# Patient Record
Sex: Female | Born: 1985 | Race: White | Hispanic: No | Marital: Single | Smoking: Current every day smoker
Health system: Southern US, Community
[De-identification: ages and names within clinical notes are randomized; demographics above are authoritative.]

## PROBLEM LIST (undated history)

## (undated) HISTORY — PX: WISDOM TOOTH EXTRACTION: SHX21

---

## 2015-02-26 ENCOUNTER — Emergency Department (HOSPITAL_COMMUNITY)
Admission: EM | Admit: 2015-02-26 | Discharge: 2015-02-26 | Disposition: A | Discharge status: Home / Self Care | Payer: Medicaid Other | Source: Home / Self Care | Attending: Emergency Medicine | Admitting: Emergency Medicine

## 2015-02-26 ENCOUNTER — Encounter (HOSPITAL_COMMUNITY): Payer: Self-pay | Admitting: Emergency Medicine

## 2015-02-26 DIAGNOSIS — B349 Viral infection, unspecified: Secondary | ICD-10-CM | POA: Diagnosis not present

## 2015-02-26 MED ORDER — ONDANSETRON HCL 4 MG PO TABS: 4.0000 mg | ORAL_TABLET | Freq: Three times a day (TID) | ORAL | Status: DC | PRN

## 2015-02-26 MED ORDER — CETIRIZINE HCL 10 MG PO TABS
10.0000 mg | ORAL_TABLET | Freq: Every day | ORAL | Status: DC
Start: 2015-02-26 — End: 2019-06-09

## 2015-02-26 NOTE — ED Provider Notes (Signed)
CSN: [id number]     Arrival date & time 02/26/15  1502 History   First MD Initiated Contact with Patient 02/26/15 1611     Chief Complaint  Patient presents with  . Emesis  . Diarrhea   (Consider location/radiation/quality/duration/timing/severity/associated sxs/prior Treatment) HPI  She is a [age] year old woman here for evaluation of vomiting and diarrhea. She states her symptoms started 6 days ago with nasal congestion, rhinorrhea, cough. On Saturday she developed some nonbloody nonbilious vomiting as well as some watery nonbloody diarrhea. She denies any fevers or chills. No abdominal pain. Her daughter was sick with a cold recently. She is tolerating liquids well.  History reviewed. No pertinent past medical history. History reviewed. No pertinent past surgical history. History reviewed. No pertinent family history. History  Substance Use Topics  . Smoking status: Current Every Day Smoker -- 1.00 packs/day    Types: Cigarettes  . Smokeless tobacco: Not on file  . Alcohol Use: Yes   OB History    No data available     Review of Systems  Constitutional: Positive for appetite change. Negative for fever and chills.  HENT: Positive for congestion and rhinorrhea. Negative for sore throat.   Respiratory: Positive for cough. Negative for shortness of breath.   Gastrointestinal: Positive for nausea, vomiting and diarrhea. Negative for abdominal pain and constipation.  Musculoskeletal: Negative for myalgias.    Allergies  Penicillins  Home Medications   Prior to Admission medications   Medication Sig Start Date End Date Taking? Authorizing Provider  cetirizine (ZYRTEC) 10 MG tablet Take 1 tablet (10 mg total) by mouth daily. 02/26/15   Charm RingsErin J Yanis Larin, MD  ondansetron (ZOFRAN) 4 MG tablet Take 1 tablet (4 mg total) by mouth every 8 (eight) hours as needed for nausea or vomiting. 02/26/15   Charm RingsErin J Sharron Petruska, MD   BP 123/76 mmHg  Pulse 62  Temp(Src) 98.9 F (37.2 C) (Oral)  Resp 16   SpO2 99%  LMP 02/18/2015 Physical Exam  Constitutional: She is oriented to person, place, and time. She appears well-developed and well-nourished. No distress.  HENT:  Nose: Nose normal.  Mouth/Throat: Oropharynx is clear and moist. No oropharyngeal exudate.  Neck: Neck supple.  Cardiovascular: Normal rate, regular rhythm and normal heart sounds.   No murmur heard. Pulmonary/Chest: Breath sounds normal. No respiratory distress. She has no wheezes. She has no rales.  Abdominal: Soft. Bowel sounds are normal. She exhibits no distension. There is no tenderness. There is no rebound and no guarding.  Neurological: She is alert and oriented to person, place, and time.    ED Course  Procedures (including critical care time) Labs Review Labs Reviewed - No data to display  Imaging Review No results found.   MDM   1. Viral illness    Symptomatic treatment with Zofran and Zyrtec. Return precautions reviewed.    Charm RingsErin J Oluwadamilola Deliz, MD 02/26/15 [phone]

## 2015-02-26 NOTE — ED Notes (Signed)
Pt states that she has had vomiting and diarrhea intermittently since 02/20/2015 pt states 3 episodes of emesis today

## 2015-02-26 NOTE — Discharge Instructions (Signed)
You have a virus. Make sure you are drinking plenty of fluids. Take Zofran every 8 hours as needed for nausea or vomiting. You should start to improve in the next day or 2. If you aren't able to keep down fluids, you develop severe pain, you see blood in your stool, please go to the ER.

## 2015-05-27 ENCOUNTER — Encounter (HOSPITAL_COMMUNITY): Payer: Self-pay | Admitting: Emergency Medicine

## 2015-05-27 ENCOUNTER — Emergency Department (HOSPITAL_COMMUNITY)
Admission: EM | Admit: 2015-05-27 | Discharge: 2015-05-27 | Disposition: A | Discharge status: Home / Self Care | Payer: Medicaid Other | Source: Home / Self Care | Attending: Emergency Medicine | Admitting: Emergency Medicine

## 2015-05-27 ENCOUNTER — Emergency Department (INDEPENDENT_AMBULATORY_CARE_PROVIDER_SITE_OTHER): Payer: Medicaid Other

## 2015-05-27 DIAGNOSIS — S9032XA Contusion of left foot, initial encounter: Secondary | ICD-10-CM
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
## 2015-05-27 NOTE — Discharge Instructions (Signed)
You have bruised your foot. Wear the postop shoe for comfort. Apply ice as often as you can. You can take Tylenol or ibuprofen as needed for pain. Follow-up as needed.
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
## 2016-03-04 ENCOUNTER — Encounter (HOSPITAL_COMMUNITY): Payer: Self-pay | Admitting: Emergency Medicine

## 2016-03-04 ENCOUNTER — Emergency Department (HOSPITAL_COMMUNITY)
Admission: EM | Admit: 2016-03-04 | Discharge: 2016-03-04 | Disposition: A | Discharge status: Home / Self Care | Payer: Medicaid Other | Attending: Emergency Medicine | Admitting: Emergency Medicine

## 2016-03-04 DIAGNOSIS — F17210 Nicotine dependence, cigarettes, uncomplicated: Secondary | ICD-10-CM | POA: Insufficient documentation

## 2016-03-04 DIAGNOSIS — K0889 Other specified disorders of teeth and supporting structures: Secondary | ICD-10-CM

## 2016-03-04 DIAGNOSIS — Z79899 Other long term (current) drug therapy: Secondary | ICD-10-CM | POA: Insufficient documentation

## 2016-03-04 DIAGNOSIS — K029 Dental caries, unspecified: Secondary | ICD-10-CM

## 2016-03-04 MED ORDER — IBUPROFEN 600 MG PO TABS
600.0000 mg | ORAL_TABLET | Freq: Four times a day (QID) | ORAL | Status: DC | PRN
Start: 2016-03-04 — End: 2019-06-09

## 2016-03-04 MED ORDER — IBUPROFEN 200 MG PO TABS
600.0000 mg | ORAL_TABLET | Freq: Once | ORAL | Status: AC
  Administered 2016-03-04: 600 mg via ORAL
  Filled 2016-03-04: qty 3

## 2016-03-04 NOTE — ED Notes (Signed)
Patient presents for bilateral upper dental pain x1 day. Able to swallow own secretions, denies fever, cough. Rates pain 10/10.

## 2016-03-04 NOTE — ED Notes (Signed)
PT DISCHARGED. INSTRUCTIONS AND PRESCRIPTION GIVEN. AAOX3. PT IN NO APPARENT DISTRESS. THE OPPORTUNITY TO ASK QUESTIONS WAS PROVIDED. 

## 2016-03-04 NOTE — Discharge Instructions (Signed)
Call Dr. Lucky CowboyKnox office first thing in the morning tell them your are being referred through the ED and they will make an appointment for you   Dental Care and Dentist Visits Dental care supports good overall health. Regular dental visits can also help you avoid dental pain, bleeding, infection, and other more serious health problems in the future. It is important to keep the mouth healthy because diseases in the teeth, gums, and other oral tissues can spread to other areas of the body. Some problems, such as diabetes, heart disease, and pre-term labor have been associated with poor oral health.  See your dentist every 6 months. If you experience emergency problems such as a toothache or broken tooth, go to the dentist right away. If you see your dentist regularly, you may catch problems early. It is easier to be treated for problems in the early stages.  WHAT TO EXPECT AT A DENTIST VISIT  Your dentist will look for many common oral health problems and recommend proper treatment. At your regular dental visit, you can expect:  Gentle cleaning of the teeth and gums. This includes scraping and polishing. This helps to remove the sticky substance around the teeth and gums (plaque). Plaque forms in the mouth shortly after eating. Over time, plaque hardens on the teeth as tartar. If tartar is not removed regularly, it can cause problems. Cleaning also helps remove stains.  Periodic X-rays. These pictures of the teeth and supporting bone will help your dentist assess the health of your teeth.  Periodic fluoride treatments. Fluoride is a natural mineral shown to help strengthen teeth. Fluoride treatmentinvolves applying a fluoride gel or varnish to the teeth. It is most commonly done in children.  Examination of the mouth, tongue, jaws, teeth, and gums to look for any oral health problems, such as:  Cavities (dental caries). This is decay on the tooth caused by plaque, sugar, and acid in the mouth. It is best  to catch a cavity when it is small.  Inflammation of the gums caused by plaque buildup (gingivitis).  Problems with the mouth or malformed or misaligned teeth.  Oral cancer or other diseases of the soft tissues or jaws. KEEP YOUR TEETH AND GUMS HEALTHY For healthy teeth and gums, follow these general guidelines as well as your dentist's specific advice:  Have your teeth professionally cleaned at the dentist every 6 months.  Brush twice daily with a fluoride toothpaste.  Floss your teeth daily.  Ask your dentist if you need fluoride supplements, treatments, or fluoride toothpaste.  Eat a healthy diet. Reduce foods and drinks with added sugar.  Avoid smoking. TREATMENT FOR ORAL HEALTH PROBLEMS If you have oral health problems, treatment varies depending on the conditions present in your teeth and gums.  Your caregiver will most likely recommend good oral hygiene at each visit.  For cavities, gingivitis, or other oral health disease, your caregiver will perform a procedure to treat the problem. This is typically done at a separate appointment. Sometimes your caregiver will refer you to another dental specialist for specific tooth problems or for surgery. SEEK IMMEDIATE DENTAL CARE IF:  You have pain, bleeding, or soreness in the gum, tooth, jaw, or mouth area.  A permanent tooth becomes loose or separated from the gum socket.  You experience a blow or injury to the mouth or jaw area.   This information is not intended to replace advice given to you by your health care provider. Make sure you discuss any questions you  have with your health care provider.   Document Released: 07/30/2011 Document Revised: 02/09/2012 Document Reviewed: 07/30/2011 Elsevier Interactive Patient Education 2016 [employer] Inc[employer]  Dental Caries Dental caries is tooth decay. This decay can cause a hole in teeth (cavity) that can get bigger and deeper over time. HOME CARE  Brush and floss your teeth. Do  this at least two times a day.  Use a fluoride toothpaste.  Use a mouth rinse if told by your dentist or doctor.  Eat less sugary and starchy foods. Drink less sugary drinks.  Avoid snacking often on sugary and starchy foods. Avoid sipping often on sugary drinks.  Keep regular checkups and cleanings with your dentist.  Use fluoride supplements if told by your dentist or doctor.  Allow fluoride to be applied to teeth if told by your dentist or doctor.   This information is not intended to replace advice given to you by your health care provider. Make sure you discuss any questions you have with your health care provider.   Document Released: 08/26/2008 Document Revised: 12/08/2014 Document Reviewed: 11/19/2012 Elsevier Interactive Patient Education [employer]

## 2016-03-04 NOTE — ED Notes (Signed)
Pt called from triage, no answer 

## 2016-03-04 NOTE — ED Provider Notes (Signed)
CSN: [id number]     Arrival date & time 03/04/16  1842 History  By signing my name below, I, Soijett Blue, attest that this documentation has been prepared under the direction and in the presence of Earley Favor,, NP Electronically Signed: Soijett Blue, ED Scribe. 03/04/2016. 8:19 PM.   Chief Complaint  Patient presents with  . Dental Pain      The history is provided by the patient. No language interpreter was used.    Deborah Curtis is a 30 y.o. female who presents to the Emergency Department complaining of bilateral upper dental pain onset today. Pt notes that she does not have her wisdom teeth at this time. She states that she is having associated symptoms of ear pain. She states that she has not tried any medications for the relief for her symptoms. She denies rhinorrhea, fever, chills, and any other symptoms.    History reviewed. No pertinent past medical history. History reviewed. No pertinent past surgical history. No family history on file. Social History  Substance Use Topics  . Smoking status: Current Every Day Smoker -- 1.00 packs/day    Types: Cigarettes  . Smokeless tobacco: None  . Alcohol Use: Yes   OB History    No data available     Review of Systems  Constitutional: Negative for fever and chills.  HENT: Positive for dental problem and ear pain.   All other systems reviewed and are negative.     Allergies  Penicillins  Home Medications   Prior to Admission medications   Medication Sig Start Date End Date Taking? Authorizing Provider  cetirizine (ZYRTEC) 10 MG tablet Take 1 tablet (10 mg total) by mouth daily. 02/26/15   Charm Rings, MD  ibuprofen (ADVIL,MOTRIN) 600 MG tablet Take 1 tablet (600 mg total) by mouth every 6 (six) hours as needed. 03/04/16   Earley Favor, NP  ondansetron (ZOFRAN) 4 MG tablet Take 1 tablet (4 mg total) by mouth every 8 (eight) hours as needed for nausea or vomiting. 02/26/15   Charm Rings, MD   BP 95/72 mmHg  Pulse 98   Temp(Src) 98.8 F (37.1 C) (Oral)  Resp 17  SpO2 97%  LMP 02/19/2016 Physical Exam  Constitutional: She is oriented to person, place, and time. She appears well-developed and well-nourished. No distress.  HENT:  Head: Normocephalic and atraumatic.  Right Ear: Tympanic membrane, external ear and ear canal normal.  Left Ear: Tympanic membrane, external ear and ear canal normal.  Mouth/Throat: Uvula is midline, oropharynx is clear and moist and mucous membranes are normal. No trismus in the jaw.  Large cavity to the right upper second molar without any surrounding erythema or swelling. Left upper area nl.  Eyes: EOM are normal.  Neck: Neck supple.  Cardiovascular: Normal rate.   Pulmonary/Chest: Effort normal. No respiratory distress.  Abdominal: Soft. There is no tenderness.  Musculoskeletal: Normal range of motion.  Neurological: She is alert and oriented to person, place, and time.  Skin: Skin is warm and dry.  Psychiatric: She has a normal mood and affect. Her behavior is normal.  Nursing note and vitals reviewed.   ED Course  Procedures (including critical care time) DIAGNOSTIC STUDIES: Oxygen Saturation is 97% on RA, nl by my interpretation.    COORDINATION OF CARE: 8:08 PM Discussed treatment plan with pt at bedside which includes referral to dentist and ibuprofen Rx and pt agreed to plan.    Labs Review Labs Reviewed - No data to display  Imaging Review No results found.    EKG Interpretation None      MDM   Final diagnoses:  Pain, dental  Dental caries   I personally performed the services described in this documentation, which was scribed in my presence. The recorded information has been reviewed and is accurate.   Earley FavorGail Soumya Colson, NP 03/05/16 2000  Alvira MondayErin Schlossman, MD 03/06/16 1320

## 2017-05-15 ENCOUNTER — Emergency Department (HOSPITAL_COMMUNITY)
Admission: EM | Admit: 2017-05-15 | Discharge: 2017-05-15 | Disposition: A | Discharge status: Home / Self Care | Payer: Medicaid Other | Attending: Emergency Medicine | Admitting: Emergency Medicine

## 2017-05-15 ENCOUNTER — Encounter (HOSPITAL_COMMUNITY): Payer: Self-pay | Admitting: Nurse Practitioner

## 2017-05-15 DIAGNOSIS — F17210 Nicotine dependence, cigarettes, uncomplicated: Secondary | ICD-10-CM | POA: Insufficient documentation

## 2017-05-15 DIAGNOSIS — K0889 Other specified disorders of teeth and supporting structures: Secondary | ICD-10-CM | POA: Insufficient documentation

## 2017-05-15 MED ORDER — IBUPROFEN 800 MG PO TABS
800.0000 mg | ORAL_TABLET | Freq: Once | ORAL | Status: AC
  Administered 2017-05-15: 800 mg via ORAL
  Filled 2017-05-15: qty 1

## 2017-05-15 MED ORDER — IBUPROFEN 800 MG PO TABS: 800.0000 mg | ORAL_TABLET | Freq: Three times a day (TID) | ORAL | 0 refills | Status: DC

## 2017-05-15 NOTE — ED Provider Notes (Signed)
[facility]-EMERGENCY DEPT Provider Note   CSN: [id number] Arrival date & time: 05/15/17  2007     History   Chief Complaint Chief Complaint  Patient presents with  . Dental Pain    HPI Deborah Curtis is a 31 y.o. female presenting with one-day history of right lower tooth pain.  Patient states that her to start to ache yesterday, but today she has had an increase in pain. She says the pain is sharp constant and worse with cold things. She denies any fevers or chills. She states she is able to open her mouth fully, has had no change in her voice, and is having no difficulty swallowing. She states that last time she had tooth pain, she was given a referral for dentist who extracted her tooth for free. This is what she is today.  HPI  History reviewed. No pertinent past medical history.  There are no active problems to display for this patient.   History reviewed. No pertinent surgical history.  OB History    No data available       Home Medications    Prior to Admission medications   Medication Sig Start Date End Date Taking? Authorizing Provider  cetirizine (ZYRTEC) 10 MG tablet Take 1 tablet (10 mg total) by mouth daily. 02/26/15   Charm RingsHonig, Erin J, MD  ibuprofen (ADVIL,MOTRIN) 600 MG tablet Take 1 tablet (600 mg total) by mouth every 6 (six) hours as needed. 03/04/16   Earley FavorSchulz, Gail, NP  ibuprofen (ADVIL,MOTRIN) 800 MG tablet Take 1 tablet (800 mg total) by mouth 3 (three) times daily. 05/15/17   Tiffanni Scarfo, PA-C  ondansetron (ZOFRAN) 4 MG tablet Take 1 tablet (4 mg total) by mouth every 8 (eight) hours as needed for nausea or vomiting. 02/26/15   Charm RingsHonig, Erin J, MD    Family History No family history on file.  Social History Social History  Substance Use Topics  . Smoking status: Current Every Day Smoker    Packs/day: 1.00    Types: Cigarettes  . Smokeless tobacco: Not on file  . Alcohol use Yes     Allergies   Penicillins   Review of Systems Review of Systems   Constitutional: Negative for chills and fever.  HENT: Positive for dental problem. Negative for trouble swallowing and voice change.      Physical Exam Updated Vital Signs BP 130/80 (BP Location: Left Arm)   Pulse (!) 54   Temp 98.6 F (37 C) (Oral)   Resp 18   Ht 5\' 3"  (1.6 m)   Wt 79.4 kg (175 lb)   SpO2 97%   BMI 31.00 kg/m   Physical Exam  Constitutional: She is oriented to person, place, and time. She appears well-developed and well-nourished. No distress.  HENT:  Head: Normocephalic and atraumatic.  Right Ear: Tympanic membrane, external ear and ear canal normal.  Left Ear: Tympanic membrane, external ear and ear canal normal.  Mouth/Throat: Uvula is midline, oropharynx is clear and moist and mucous membranes are normal.  Multiple dental caries, and obvious dental decay of right lower back molar. No obvious erythema or swelling of the surrounding gum. Tenderness of tooth, but none of the surrounding teeth.   Eyes: Conjunctivae and EOM are normal. Pupils are equal, round, and reactive to light.  Neck: Normal range of motion. Neck supple. No tracheal deviation present.  Cardiovascular: Normal rate, regular rhythm, normal heart sounds and intact distal pulses.   Pulmonary/Chest: Effort normal and breath sounds normal.  Lymphadenopathy:  She has no cervical adenopathy.  Neurological: She is alert and oriented to person, place, and time.  Skin: Skin is warm and dry.  Psychiatric: She has a normal mood and affect.     ED Treatments / Results  Labs (all labs ordered are listed, but only abnormal results are displayed) Labs Reviewed - No data to display  EKG  EKG Interpretation None       Radiology No results found.  Procedures Procedures (including critical care time)  Medications Ordered in ED Medications  ibuprofen (ADVIL,MOTRIN) tablet 800 mg (800 mg Oral Given 05/15/17 2146)     Initial Impression / Assessment and Plan / ED Course  I have reviewed  the triage vital signs and the nursing notes.  Pertinent labs & imaging results that were available during my care of the patient were reviewed by me and considered in my medical decision making (see chart for details).     Discussed with patient option of dental block. Patient states that she does not want to do this, and is okay with ibuprofen for pain management. She will follow-up with dentistry, multiple names provided for her. Return precautions given. Patient agrees to plan.  Final Clinical Impressions(s) / ED Diagnoses   Final diagnoses:  Pain, dental    New Prescriptions Discharge Medication List as of 05/15/2017  9:51 PM    START taking these medications   Details  !! ibuprofen (ADVIL,MOTRIN) 800 MG tablet Take 1 tablet (800 mg total) by mouth 3 (three) times daily., Starting Fri 05/15/2017, Print     !! - Potential duplicate medications found. Please discuss with provider.       Alveria Apley, PA-C 05/15/17 2316    Raeford Razor, MD 05/22/17 1147

## 2017-05-15 NOTE — ED Triage Notes (Signed)
Pt is c/o severe dental pain that started this morning.

## 2017-05-15 NOTE — Discharge Instructions (Signed)
Take ibuprofen 800 as needed for pain. Take this with meals. Follow-up with the dentist. Included information for the same dentist you saw last time, as well as additional options for you.  Return to the ED if he experiences fevers, chills, significant worsening of pain, change in voice, or difficulty swallowing.

## 2017-05-15 NOTE — ED Notes (Signed)
Pt c/o mild dental pain for the past few days and has noticed inflammation. Today, 10/10 dental pain and pt has taken extra strength tylenol, ibuprofen and tramadol for pain management.

## 2018-08-09 ENCOUNTER — Encounter (HOSPITAL_COMMUNITY): Payer: Self-pay | Admitting: Emergency Medicine

## 2018-08-09 ENCOUNTER — Other Ambulatory Visit: Payer: Self-pay

## 2018-08-09 ENCOUNTER — Emergency Department (HOSPITAL_COMMUNITY)
Admission: EM | Admit: 2018-08-09 | Discharge: 2018-08-10 | Disposition: A | Discharge status: Home / Self Care | Payer: 59 | Attending: Emergency Medicine | Admitting: Emergency Medicine

## 2018-08-09 DIAGNOSIS — F17210 Nicotine dependence, cigarettes, uncomplicated: Secondary | ICD-10-CM | POA: Insufficient documentation

## 2018-08-09 DIAGNOSIS — R1084 Generalized abdominal pain: Secondary | ICD-10-CM | POA: Diagnosis present

## 2018-08-09 DIAGNOSIS — Z79899 Other long term (current) drug therapy: Secondary | ICD-10-CM | POA: Insufficient documentation

## 2018-08-09 LAB — COMPREHENSIVE METABOLIC PANEL
ALBUMIN: 4 g/dL (ref 3.5–5.0)
ALK PHOS: 70 U/L (ref 38–126)
ALT: 15 U/L (ref 0–44)
AST: 15 U/L (ref 15–41)
Anion gap: 8 (ref 5–15)
BUN: 6 mg/dL (ref 6–20)
CALCIUM: 9.6 mg/dL (ref 8.9–10.3)
CO2: 25 mmol/L (ref 22–32)
CREATININE: 0.82 mg/dL (ref 0.44–1.00)
Chloride: 108 mmol/L (ref 98–111)
GFR calc non Af Amer: >60 mL/min (ref >60)
GLUCOSE: 91 mg/dL (ref 70–99)
Potassium: 4 mmol/L (ref 3.5–5.1)
SODIUM: 141 mmol/L (ref 135–145)
Total Bilirubin: 0.5 mg/dL (ref 0.3–1.2)
Total Protein: 7.2 g/dL (ref 6.5–8.1)

## 2018-08-09 LAB — CBC
HCT: 38 % (ref 36.0–46.0)
HEMOGLOBIN: 12.3 g/dL (ref 12.0–15.0)
MCH: 30.4 pg (ref 26.0–34.0)
MCHC: 32.4 g/dL (ref 30.0–36.0)
MCV: 94.1 fL (ref 78.0–100.0)
PLATELETS: 285 10*3/uL (ref 150–400)
RBC: 4.04 MIL/uL (ref 3.87–5.11)
RDW: 13.9 % (ref 11.5–15.5)
WBC: 8.5 10*3/uL (ref 4.0–10.5)

## 2018-08-09 LAB — LIPASE, BLOOD: LIPASE: 27 U/L (ref 11–51)

## 2018-08-09 LAB — I-STAT BETA HCG BLOOD, ED (MC, WL, AP ONLY)

## 2018-08-09 NOTE — ED Triage Notes (Signed)
Pt reports 10/10 upper burning abd pain that started yesterday. Pt reports burning after drinking, eating and swallowing saliva. Denies N/V/D. Pt tearful in triage.

## 2018-08-10 ENCOUNTER — Emergency Department (HOSPITAL_COMMUNITY): Payer: 59

## 2018-08-10 LAB — URINALYSIS, ROUTINE W REFLEX MICROSCOPIC
Bilirubin Urine: NEGATIVE
Glucose, UA: NEGATIVE mg/dL
Hgb urine dipstick: NEGATIVE
Ketones, ur: 20 mg/dL — AB
LEUKOCYTES UA: NEGATIVE
Nitrite: NEGATIVE
PROTEIN: NEGATIVE mg/dL
Specific Gravity, Urine: 1.028 (ref 1.005–1.030)
pH: 5 (ref 5.0–8.0)

## 2018-08-10 MED ORDER — DICYCLOMINE HCL 10 MG PO CAPS
20.0000 mg | ORAL_CAPSULE | Freq: Once | ORAL | Status: DC
  Filled 2018-08-10: qty 2

## 2018-08-10 MED ORDER — METOCLOPRAMIDE HCL 10 MG PO TABS: 10.0000 mg | ORAL_TABLET | Freq: Four times a day (QID) | ORAL | 0 refills | Status: DC

## 2018-08-10 MED ORDER — GI COCKTAIL ~~LOC~~
30.0000 mL | Freq: Once | ORAL | Status: AC
  Administered 2018-08-10: 30 mL via ORAL
  Filled 2018-08-10: qty 30

## 2018-08-10 NOTE — ED Notes (Signed)
Patient left at this time with all belongings. 

## 2018-08-10 NOTE — ED Notes (Signed)
Pt immediately vomiting after GI cocktail administration.

## 2018-08-10 NOTE — ED Provider Notes (Signed)
[facility] EMERGENCY DEPARTMENT Provider Note   CSN: [id number] Arrival date & time: 08/09/18  1800     History   Chief Complaint Chief Complaint  Patient presents with  . Abdominal Pain    HPI Deborah Curtis is a 32 y.o. female.  Patient presents with complaint of generalized abdominal pain that started yesterday. She described a burning sensation. No fever, nausea, vomiting, constipation or diarrhea. She has not tried anything for symptoms. She states pain is increased with eating/drinking, straining for a bowel movement, urination, coughing. She does not notice that there is any swelling to the abdomen. No history of similar symptoms. No regular use of NSAIDs.  The history is provided by the patient. No language interpreter was used.  Abdominal Pain   Pertinent negatives include fever, diarrhea, nausea, vomiting and constipation.    History reviewed. No pertinent past medical history.  There are no active problems to display for this patient.   History reviewed. No pertinent surgical history.   OB History   None      Home Medications    Prior to Admission medications   Medication Sig Start Date End Date Taking? Authorizing Provider  cetirizine (ZYRTEC) 10 MG tablet Take 1 tablet (10 mg total) by mouth daily. 02/26/15   Charm Rings, MD  ibuprofen (ADVIL,MOTRIN) 600 MG tablet Take 1 tablet (600 mg total) by mouth every 6 (six) hours as needed. 03/04/16   Earley Favor, NP  ibuprofen (ADVIL,MOTRIN) 800 MG tablet Take 1 tablet (800 mg total) by mouth 3 (three) times daily. 05/15/17   Caccavale, Sophia, PA-C  ondansetron (ZOFRAN) 4 MG tablet Take 1 tablet (4 mg total) by mouth every 8 (eight) hours as needed for nausea or vomiting. 02/26/15   Charm Rings, MD    Family History No family history on file.  Social History Social History   Tobacco Use  . Smoking status: Current Every Day Smoker    Packs/day: 0.50    Types: Cigarettes  . Smokeless  tobacco: Never Used  Substance Use Topics  . Alcohol use: Yes    Comment: occ  . Drug use: Not on file     Allergies   Penicillins   Review of Systems Review of Systems  Constitutional: Negative for chills and fever.  HENT: Negative.   Respiratory: Negative.  Negative for cough and shortness of breath.   Cardiovascular: Negative.  Negative for chest pain.  Gastrointestinal: Positive for abdominal pain. Negative for constipation, diarrhea, nausea and vomiting.  Musculoskeletal: Negative.  Negative for back pain.  Skin: Negative.   Neurological: Negative.      Physical Exam Updated Vital Signs BP (!) 122/59 (BP Location: Right Arm)   Pulse 70   Temp 98.3 F (36.8 C) (Oral)   Resp 16   Ht 5\' 3"  (1.6 m)   Wt 77.1 kg   LMP 07/26/2018   SpO2 96%   BMI 30.11 kg/m   Physical Exam  Constitutional: She appears well-developed and well-nourished.  Uncomfortable appearing.   HENT:  Head: Normocephalic.  Neck: Normal range of motion. Neck supple.  Cardiovascular: Normal rate and regular rhythm.  Pulmonary/Chest: Effort normal and breath sounds normal. She has no wheezes. She has no rhonchi. She has no rales. She exhibits no tenderness.  Abdominal: Soft. Bowel sounds are normal. There is generalized tenderness. There is no rigidity, no rebound and no guarding.  Musculoskeletal: Normal range of motion.  Neurological: She is alert. No cranial nerve deficit.  Skin: Skin is warm and dry. No rash noted.  Psychiatric: She has a normal mood and affect.  Nursing note and vitals reviewed.    ED Treatments / Results  Labs (all labs ordered are listed, but only abnormal results are displayed) Labs Reviewed  URINALYSIS, ROUTINE W REFLEX MICROSCOPIC - Abnormal; Notable for the following components:      Result Value   APPearance HAZY (*)    Ketones, ur 20 (*)    All other components within normal limits  LIPASE, BLOOD  COMPREHENSIVE METABOLIC PANEL  CBC  I-STAT BETA HCG BLOOD,  ED ([facility], [facility], [facility] ONLY)    EKG None  Radiology No results found.  Procedures Procedures (including critical care time)  Medications Ordered in ED Medications  gi cocktail (Maalox,Lidocaine,Donnatal) (has no administration in time range)     Initial Impression / Assessment and Plan / ED Course  I have reviewed the triage vital signs and the nursing notes.  Pertinent labs & imaging results that were available during my care of the patient were reviewed by me and considered in my medical decision making (see chart for details).     Patient presents with 24 hours of generalized abdominal pain, "sharp, burning". No fever, N, V, D or constipation. No history of same. No abdominal surgeries in the past. She has diffuse abdominal tenderness. VSS.   Labs are unremarkable. No sign of infection or inflammatory process. GI cocktail provided that causes vomiting immediately after taking it.   She is sleeping on re-evaluation. Imaging shows possible enteritis but no obstructive findings.   Will give Reglan and encourage PCP follow up. The patient is comfortable with discharge home.   Final Clinical Impressions(s) / ED Diagnoses   Final diagnoses:  None   1. Abdominal pain  ED Discharge Orders    None       Elpidio Anis, PA-C 08/10/18 1448    Ward, Layla Maw, DO 08/10/18 [phone]

## 2018-08-10 NOTE — Discharge Instructions (Addendum)
Your labs and x-ray do not show any significant infection or concerning process. You can be discharged home with prescription for Reglan which should help relieve pain. Return to the emergency department with any high fever, bloody stools, uncontrolled vomiting or severe pain. Otherwise, follow up with your doctor (or choose from the resource list provided for primary care doctors.).

## 2019-06-09 ENCOUNTER — Other Ambulatory Visit: Payer: Self-pay

## 2019-06-09 ENCOUNTER — Emergency Department (HOSPITAL_BASED_OUTPATIENT_CLINIC_OR_DEPARTMENT_OTHER): Payer: BC Managed Care – PPO

## 2019-06-09 ENCOUNTER — Emergency Department (HOSPITAL_BASED_OUTPATIENT_CLINIC_OR_DEPARTMENT_OTHER)
Admission: EM | Admit: 2019-06-09 | Discharge: 2019-06-09 | Disposition: A | Discharge status: Home / Self Care | Payer: BC Managed Care – PPO | Attending: Emergency Medicine | Admitting: Emergency Medicine

## 2019-06-09 ENCOUNTER — Encounter (HOSPITAL_BASED_OUTPATIENT_CLINIC_OR_DEPARTMENT_OTHER): Payer: Self-pay | Admitting: Emergency Medicine

## 2019-06-09 DIAGNOSIS — Y929 Unspecified place or not applicable: Secondary | ICD-10-CM | POA: Insufficient documentation

## 2019-06-09 DIAGNOSIS — X501XXA Overexertion from prolonged static or awkward postures, initial encounter: Secondary | ICD-10-CM | POA: Diagnosis not present

## 2019-06-09 DIAGNOSIS — Y999 Unspecified external cause status: Secondary | ICD-10-CM | POA: Diagnosis not present

## 2019-06-09 DIAGNOSIS — S99922A Unspecified injury of left foot, initial encounter: Secondary | ICD-10-CM

## 2019-06-09 DIAGNOSIS — Y9389 Activity, other specified: Secondary | ICD-10-CM | POA: Diagnosis not present

## 2019-06-09 DIAGNOSIS — F17210 Nicotine dependence, cigarettes, uncomplicated: Secondary | ICD-10-CM | POA: Diagnosis not present

## 2019-06-09 MED ORDER — IBUPROFEN 800 MG PO TABS
800.0000 mg | ORAL_TABLET | Freq: Once | ORAL | Status: AC
  Administered 2019-06-09: 800 mg via ORAL
  Filled 2019-06-09: qty 1

## 2019-06-09 NOTE — ED Triage Notes (Signed)
Tripped in driveway yesterday morning while carrying a couch.  Injury to lateral left foot.  Was able to walk yesterday but not today. Mild swelling.

## 2019-06-09 NOTE — ED Notes (Signed)
ED Provider at bedside. 

## 2019-06-09 NOTE — Discharge Instructions (Signed)
You were seen today for pain in your left foot Please read and follow all provided instructions.    1. Medications: alternate ibuprofen and tylenol for pain control, continue usual home medications  2. Treatment: rest, ice, elevate and use ace wrap and crutches, drink plenty of fluids, gentle stretching  3. Follow Up: Please followup with orthopedics as directed or your PCP in 1 week if no improvement for discussion of your diagnoses and further evaluation after today's visit; if you do not have a primary care doctor use the resource guide provided to find one; Please return to the ER for worsening symptoms or other concerns

## 2019-06-09 NOTE — ED Provider Notes (Signed)
[facility] EMERGENCY DEPARTMENT Provider Note   CSN: [id number] Arrival date & time: 06/09/19  1315    History   Chief Complaint Chief Complaint  Patient presents with  . Foot Injury    HPI Deborah Curtis is a 33 y.o. female otherwise healthy presents emergency department today with chief complaint of left foot pain x2 days.  Patient states she was carrying a couch yesterday and tripped on a stump in the yard causing her to evert her left ankle.  She did not fall.  She was able to walk and go out to lunch after the injury.  She states after finishing eating proximately 2 hours later she went to stand up and was unable to bear weight on the left foot.  She states the pain is located on the top of her foot and radiates to her ankle.  She describes the pain as a throbbing.  She rates it 10 out of 10 in severity.  She not take any for pain prior to arrival however did try soaking her foot in Epsom salt with minimal relief.  She denies any fever, chills, numbness, tingling, wound.   History reviewed. No pertinent past medical history.  There are no active problems to display for this patient.   Past Surgical History:  Procedure Laterality Date  . WISDOM TOOTH EXTRACTION       OB History   No obstetric history on file.      Home Medications    Prior to Admission medications   Not on File    Family History No family history on file.  Social History Social History   Tobacco Use  . Smoking status: Current Every Day Smoker    Packs/day: 0.50    Types: Cigarettes  . Smokeless tobacco: Never Used  Substance Use Topics  . Alcohol use: Yes    Comment: occ  . Drug use: Never     Allergies   Amoxicillin and Penicillins   Review of Systems Review of Systems  Constitutional: Negative for chills and fever.  Musculoskeletal: Positive for arthralgias and joint swelling.  Skin: Negative for wound.  Allergic/Immunologic: Negative for immunocompromised state.     Physical Exam Updated Vital Signs BP 129/80 (BP Location: Right Arm)   Pulse 71   Temp 98.4 F (36.9 C) (Oral)   Resp 16   Ht 5\' 3"  (1.6 m)   Wt 73.9 kg   LMP 06/05/2019   SpO2 100%   BMI 28.87 kg/m   Physical Exam Vitals signs and nursing note reviewed.  Constitutional:      Appearance: She is well-developed. She is not ill-appearing or toxic-appearing.  HENT:     Head: Normocephalic and atraumatic.     Nose: Nose normal.  Eyes:     General: No scleral icterus.       Right eye: No discharge.        Left eye: No discharge.     Conjunctiva/sclera: Conjunctivae normal.  Neck:     Musculoskeletal: Normal range of motion.     Vascular: No JVD.  Cardiovascular:     Rate and Rhythm: Normal rate and regular rhythm.     Pulses: Normal pulses.     Heart sounds: Normal heart sounds.  Pulmonary:     Effort: Pulmonary effort is normal.     Breath sounds: Normal breath sounds.  Abdominal:     General: There is no distension.  Musculoskeletal: Normal range of motion.     Comments: There  is no swelling and tenderness over the lateral malleolus. No overt deformity. No tenderness over the medial aspect of the ankle. The fifth metatarsal is non tender. The ankle joint is intact without excessive opening on stressing.  Tender to palpation of forefoot.  No break in skin.  Good pedal pulse and cap refill of all toes.  Wiggling toes without difficulty.   Skin:    General: Skin is warm and dry.  Neurological:     Mental Status: She is oriented to person, place, and time.     GCS: GCS eye subscore is 4. GCS verbal subscore is 5. GCS motor subscore is 6.     Comments: Fluent speech, no facial droop.  Psychiatric:        Behavior: Behavior normal.      ED Treatments / Results  Labs (all labs ordered are listed, but only abnormal results are displayed) Labs Reviewed - No data to display  EKG None  Radiology Dg Ankle Complete Left  Result Date: 06/09/2019 CLINICAL DATA:   Twisting injury yesterday. Pain and swelling anterior and lateral. EXAM: LEFT ANKLE COMPLETE - 3+ VIEW COMPARISON:  05/28/2015 FINDINGS: No evidence of acute fracture or dislocation. Small calcification inferior to the medial malleolus suggests previous deltoid ligament injury. IMPRESSION: No acute bone or joint finding. Small calcification inferior to the medial malleolus consistent with old ligamentous injury. Electronically Signed   By: Nelson Chimes M.D.   On: 06/09/2019 14:06   Dg Foot Complete Left  Result Date: 06/09/2019 CLINICAL DATA:  Twisting injury yesterday. Anterior and lateral pain. EXAM: LEFT FOOT - COMPLETE 3+ VIEW COMPARISON:  05/28/2015 FINDINGS: There is no evidence of fracture or dislocation. There is no evidence of arthropathy or other focal bone abnormality. Soft tissues are unremarkable. Mild hallux valgus deformity of the great toe. IMPRESSION: No acute or traumatic finding. Mild hallux valgus deformity of the great toe. Electronically Signed   By: Nelson Chimes M.D.   On: 06/09/2019 14:07    Procedures Procedures (including critical care time)  Medications Ordered in ED Medications  ibuprofen (ADVIL) tablet 800 mg (800 mg Oral Given 06/09/19 1340)     Initial Impression / Assessment and Plan / ED Course  I have reviewed the triage vital signs and the nursing notes.  Pertinent labs & imaging results that were available during my care of the patient were reviewed by me and considered in my medical decision making (see chart for details).  Patient presents to the ED with complaints of pain to the left foot s/p eversion. Exam without obvious deformity or open wounds. ROM intact. Tender to palpation . NVI distally.  X-ray of left foot and ankle viewed by me are negative for fracture and dislocation. Therapeutic splint provided. PRICE and motrin recommended. I discussed results, treatment plan, need for follow-up, and return precautions with the patient. Provided opportunity for  questions, patient confirmed understanding and are in agreement with plan. She is stable at discharge.    This note was prepared using Dragon voice recognition software and may include unintentional dictation errors due to the inherent limitations of voice recognition software.    Final Clinical Impressions(s) / ED Diagnoses   Final diagnoses:  Injury of left foot, initial encounter    ED Discharge Orders    None       Flint Melter 06/09/19 1520    Isla Pence, MD 06/10/19 [phone]

## 2020-03-12 IMAGING — DX LEFT FOOT - COMPLETE 3+ VIEW
3 series · 3 of 3 positions shown · non-contrast
Comparison: 05/28/2015

CLINICAL DATA: Twisting injury yesterday. Anterior and lateral
pain.

EXAM:
LEFT FOOT - COMPLETE 3+ VIEW

[foot ap]
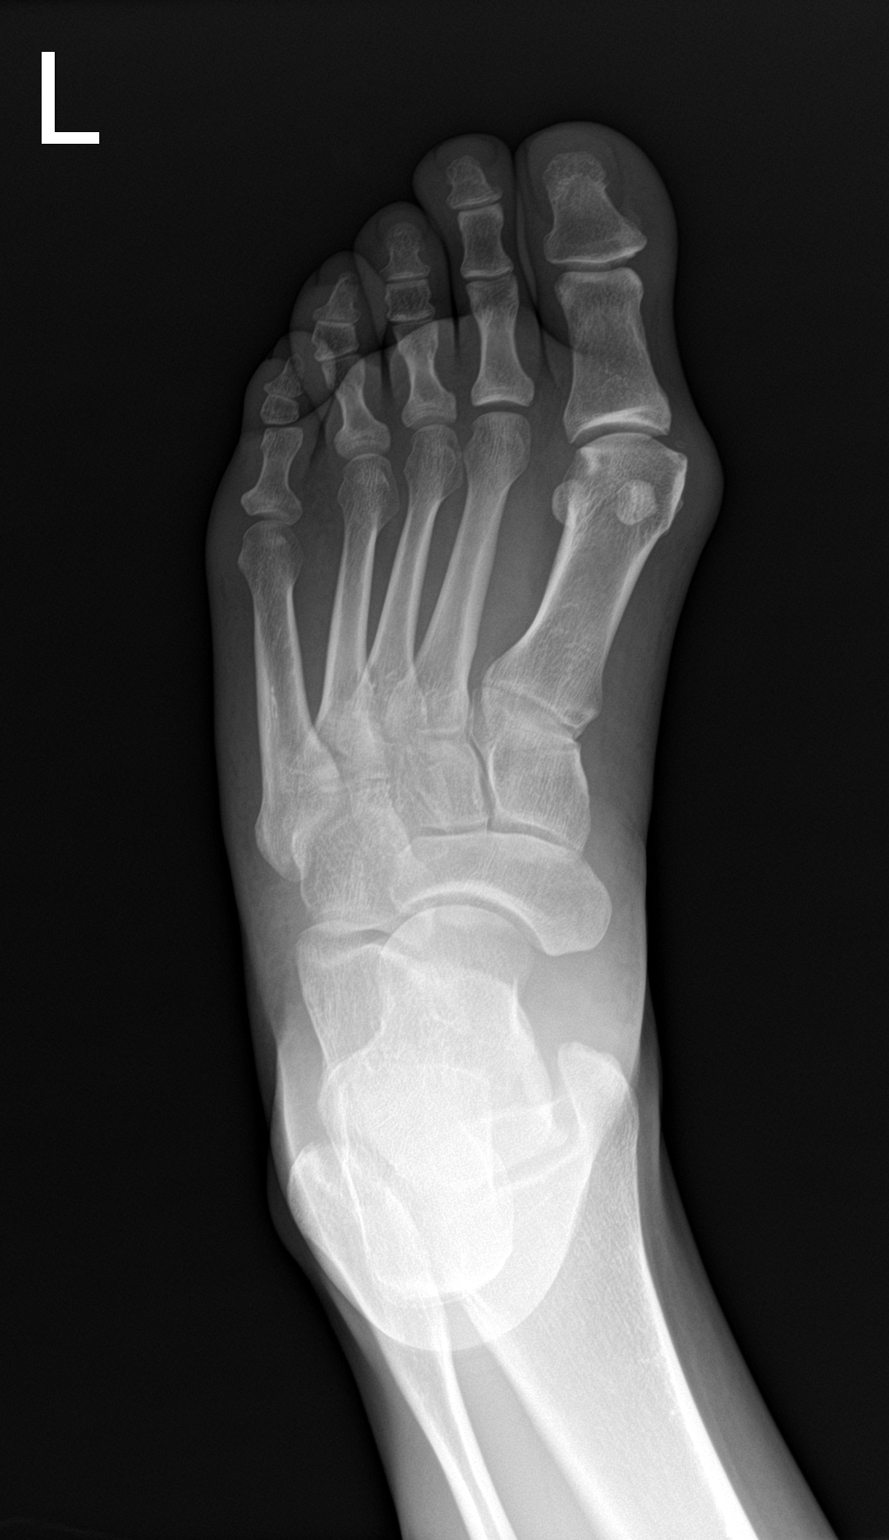

[foot obl]
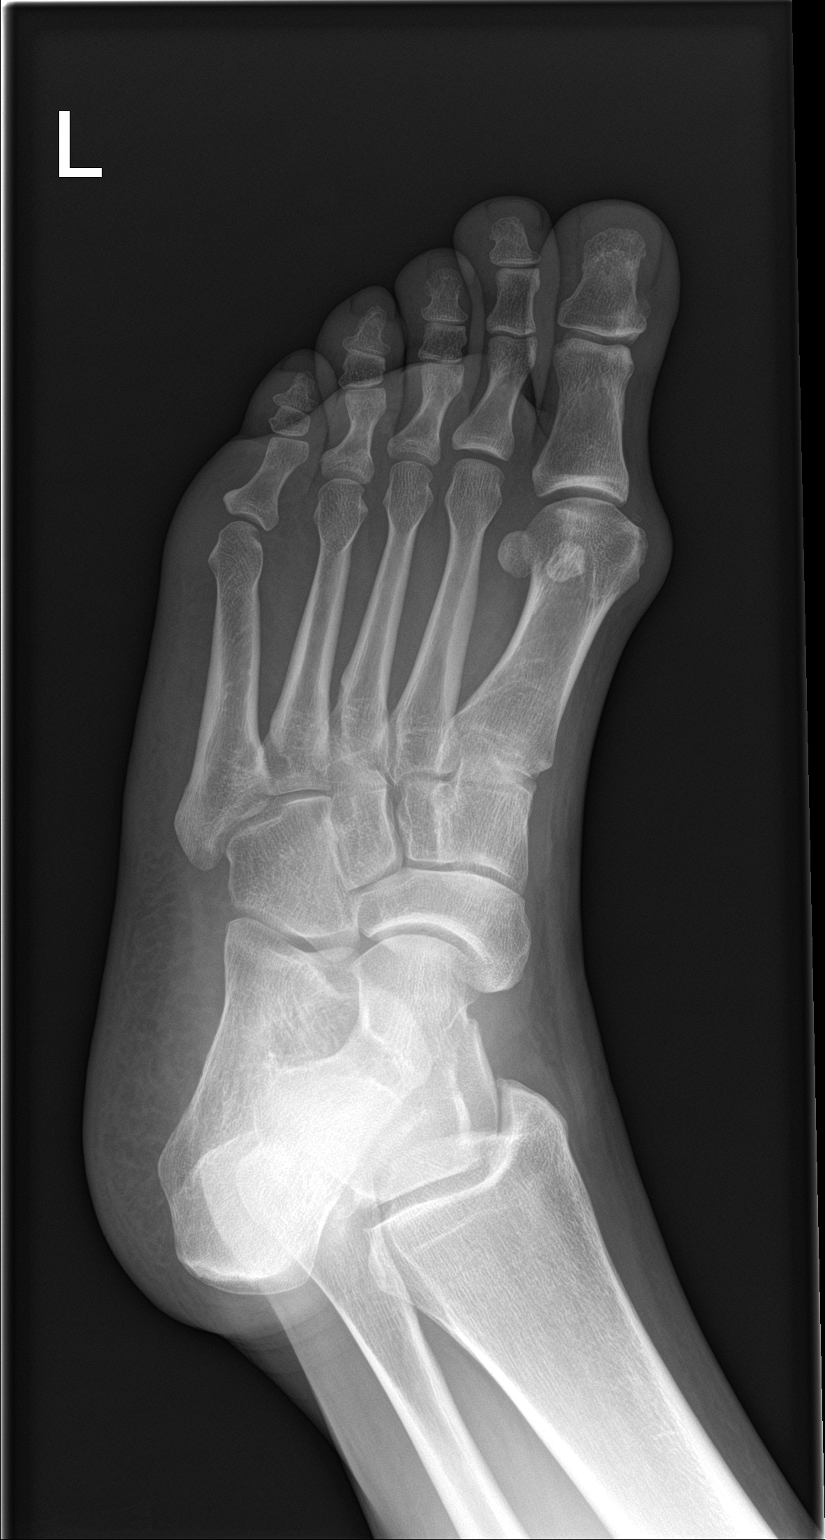

[foot lat]
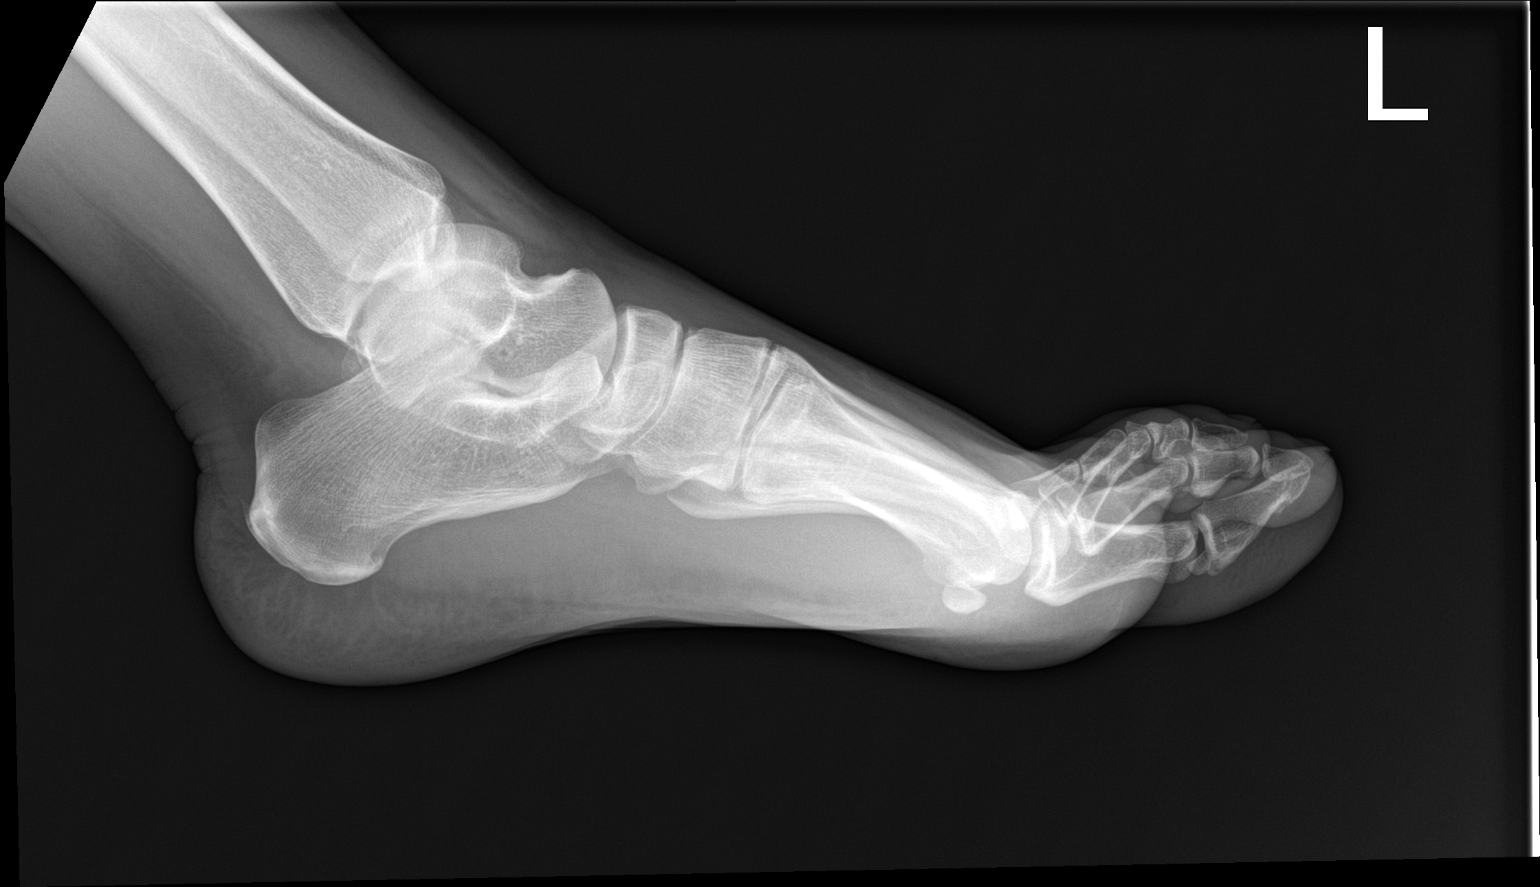

[3 of 3 positions shown; findings below may reference images not displayed]

FINDINGS: There is no evidence of fracture or dislocation. There is no
evidence of arthropathy or other focal bone abnormality. Soft
tissues are unremarkable. Mild hallux valgus deformity of the great
toe.
IMPRESSION: No acute or traumatic finding. Mild hallux valgus deformity of the
great toe.

## 2023-04-24 ENCOUNTER — Encounter (HOSPITAL_BASED_OUTPATIENT_CLINIC_OR_DEPARTMENT_OTHER): Payer: Self-pay

## 2023-04-24 ENCOUNTER — Emergency Department (HOSPITAL_BASED_OUTPATIENT_CLINIC_OR_DEPARTMENT_OTHER)
Admission: EM | Admit: 2023-04-24 | Discharge: 2023-04-24 | Disposition: A | Discharge status: Home / Self Care | Payer: Managed Care, Other (non HMO) | Attending: Emergency Medicine | Admitting: Emergency Medicine

## 2023-04-24 ENCOUNTER — Other Ambulatory Visit: Payer: Self-pay

## 2023-04-24 DIAGNOSIS — F17200 Nicotine dependence, unspecified, uncomplicated: Secondary | ICD-10-CM | POA: Diagnosis not present

## 2023-04-24 DIAGNOSIS — N611 Abscess of the breast and nipple: Secondary | ICD-10-CM | POA: Diagnosis not present

## 2023-04-24 DIAGNOSIS — N644 Mastodynia: Secondary | ICD-10-CM | POA: Diagnosis present

## 2023-04-24 DIAGNOSIS — N6452 Nipple discharge: Secondary | ICD-10-CM

## 2023-04-24 LAB — PREGNANCY, URINE: Preg Test, Ur: NEGATIVE

## 2023-04-24 MED ORDER — CLINDAMYCIN HCL 150 MG PO CAPS: 450.0000 mg | ORAL_CAPSULE | Freq: Three times a day (TID) | ORAL | 0 refills | Status: AC

## 2023-04-24 NOTE — ED Triage Notes (Signed)
Pt has had Right breast pain for 3  days with "yellow pus" that began yesterday.  Pt is not breast feeding nor any possible way pregnant.

## 2023-04-24 NOTE — ED Provider Notes (Signed)
[place] EMERGENCY DEPARTMENT AT [facility] Provider Note   CSN: [id number] Arrival date & time: 04/24/23  2000     History Chief Complaint  Patient presents with   Breast Pain    Deborah Curtis is a 37 y.o. female reportedly otherwise healthy presents the emergency room today for evaluation of right breast pain for the past 3 days.  She reports that she started noticing some yellow pus draining from her nipple as well.  Only drainage and pain on the right, not on the left.  Denies any trauma to the area.  Has not breast-fed in years.  She is not concerned for any pregnancy given that she is in a relationship with another female.  She denies any fevers or chills.  Denies any night sweats.  Allergic to amoxicillin/penicillin.  Daily tobacco use.  HPI     Home Medications Prior to Admission medications   Not on File      Allergies    Amoxicillin and Penicillins    Review of Systems   Review of Systems  Constitutional:  Negative for chills and fever.  Respiratory:  Negative for shortness of breath.   Cardiovascular:  Negative for chest pain.  Gastrointestinal:  Negative for abdominal pain, constipation, diarrhea, nausea and vomiting.  Genitourinary:  Negative for dysuria and hematuria.       Reports right breast pain and nipple discharge    Physical Exam Updated Vital Signs BP 113/74   Pulse 72   Temp 98.5 F (36.9 C) (Oral)   Resp 18   Ht 5\' 4"  (1.626 m)   Wt 86.2 kg   LMP 04/23/2023   SpO2 98%   BMI 32.61 kg/m  Physical Exam Vitals and nursing note reviewed. Exam conducted with a chaperone present.  Constitutional:      General: She is not in acute distress.    Appearance: Normal appearance. She is not ill-appearing or toxic-appearing.  Eyes:     General: No scleral icterus. Pulmonary:     Effort: Pulmonary effort is normal. No respiratory distress.  Chest:  Breasts:    Right: Inverted nipple present.     Left: Inverted nipple present.      Comments: Inverted nipples bilaterally which is patient's normal.  On the right breast, there is some more firmness noted to the superior aspect of the nipple and areola.  Some yellow crusting noted but no active discharge.  Unable to get any expressed with palpation upon the breast.  I do not feel any distinct mass however the right breast does look slightly larger than the left.  No palpable fluctuance appreciated.  I do not feel any lymphadenopathy into the axilla. Skin:    General: Skin is dry.     Findings: No rash.  Neurological:     General: No focal deficit present.     Mental Status: She is alert. Mental status is at baseline.  Psychiatric:        Mood and Affect: Mood normal.     ED Results / Procedures / Treatments   Labs (all labs ordered are listed, but only abnormal results are displayed) Labs Reviewed  PREGNANCY, URINE    EKG None  Radiology No results found.  Procedures Procedures   Medications Ordered in ED Medications - No data to display  ED Course/ Medical Decision Making/ A&P    Medical Decision Making Amount and/or Complexity of Data Reviewed Labs: ordered. Radiology: ordered.  Risk Prescription drug management.   36  y.o. female presents to the ER today for evaluation of right breast pain with nipple discharge. Differential diagnosis includes but is not limited to mastitis, breast abscess, cellulitis, malignancy. Vital signs show unremarkable. Physical exam as noted above.   I independently reviewed and interpreted the patient's labs.  Pregnancy test is negative.  On exam, I do not appreciate any distinct mass or abscess.  She does have small amount of firmness and swelling noted to the more superior aspect of the nipple and areola.  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  I do not feel any lymphadenopathy into the axilla.  Her vital signs are stable, no fevers.  Will send her home with an antibiotic and  breast ultrasound outpatient.  Gave her follow-up with a primary care doctor as well.  Additionally, she has not had a mammogram yet, [facility].  Given her allergy to penicillins, we will start her on clindamycin.  We discussed plan at bedside including follow-up with gynecology/PCP, take pain medication as needed, follow-up with breast ultrasound, take antibiotics. We discussed strict return precautions and red flag symptoms. The patient verbalized their understanding and agrees to the plan. The patient is stable and being discharged home in good condition.  Portions of this report may have been transcribed using voice recognition software. Every effort was made to ensure accuracy; however, inadvertent computerized transcription errors may be present.    Final Clinical Impression(s) / ED Diagnoses Final diagnoses:  Breast abscess of female    Rx / DC Orders ED Discharge Orders          Ordered    US BREAST COMPLETE UNI RIGHT INC AXILLA        04/24/23 2121    clindamycin (CLEOCIN) 150 MG capsule  3 times daily        04/24/23 2122              Achille Rich, PA-C 04/27/23 1545    Loetta Rough, MD 05/01/23 [phone]

## 2023-04-24 NOTE — Discharge Instructions (Addendum)
You were seen in the ER today for evaluation of your right breast pain.  The peers that you may have an abscess to your breast.  Unfortunately, we do not do any ultrasounds here.  You are going to need an antibiotic for this.  I am prescribing this to you.  Please take as directed and take the entirety of the course.  You can also do warm compresses to the area as well.  Please make sure the area is staying clean.  Please follow-up with the breath center for ultrasound.  I am also giving referral for primary care doctor and gynecologist.  Please make sure you call to schedule appointments to these as well.  If you have any concerns, new or worsening symptoms, please return to the nearest emergency department for evaluation.  Contact a doctor if: You have pus-like fluid leaking from the breast. You have a fever. Your pain and swelling get worse. Your symptoms do not start to get better within 2 days of starting treatment. Your pain is not helped by medicine. Get help right away if: You have a red line going from your breast toward your armpit.

## 2023-04-24 NOTE — ED Notes (Signed)
Discharge instructions reviewed with patient. Patient questions answered and opportunity for education reviewed. Patient voices understanding of discharge instructions with no further questions. Patient ambulatory with steady gait to lobby.  

## 2023-04-29 ENCOUNTER — Other Ambulatory Visit: Payer: Self-pay | Admitting: Emergency Medicine

## 2023-04-29 DIAGNOSIS — N6452 Nipple discharge: Secondary | ICD-10-CM

## 2023-11-05 ENCOUNTER — Emergency Department (HOSPITAL_COMMUNITY)
Admission: EM | Admit: 2023-11-05 | Discharge: 2023-11-06 | Discharge status: Against Medical Advice | Payer: Medicaid Other | Attending: Emergency Medicine | Admitting: Emergency Medicine

## 2023-11-05 DIAGNOSIS — M25561 Pain in right knee: Secondary | ICD-10-CM | POA: Insufficient documentation

## 2023-11-05 DIAGNOSIS — W1840XA Slipping, tripping and stumbling without falling, unspecified, initial encounter: Secondary | ICD-10-CM | POA: Diagnosis not present

## 2023-11-05 DIAGNOSIS — Z5321 Procedure and treatment not carried out due to patient leaving prior to being seen by health care provider: Secondary | ICD-10-CM | POA: Diagnosis not present

## 2023-11-05 DIAGNOSIS — M25569 Pain in unspecified knee: Secondary | ICD-10-CM

## 2023-11-05 NOTE — ED Triage Notes (Signed)
Pt arrived from home via [facility] c/o after tripping on a tire pt's knee buckled and now pt is unable to bend knee or put any weight on knee. Pt states that her knee just buckles. Pt is tearful in triage, pain 10/10

## 2023-11-06 ENCOUNTER — Other Ambulatory Visit: Payer: Self-pay

## 2023-11-06 ENCOUNTER — Encounter (HOSPITAL_COMMUNITY): Payer: Self-pay

## 2023-11-06 ENCOUNTER — Emergency Department (HOSPITAL_COMMUNITY): Payer: Medicaid Other

## 2023-11-06 MED ORDER — OXYCODONE-ACETAMINOPHEN 5-325 MG PO TABS
1.0000 | ORAL_TABLET | ORAL | Status: DC | PRN
Start: 2023-11-06 — End: 2023-11-06
  Administered 2023-11-06: 1 via ORAL
  Filled 2023-11-06: qty 1

## 2023-11-06 NOTE — ED Notes (Signed)
Patient not in room at this time.

## 2023-11-06 NOTE — ED Provider Notes (Signed)
Patient left before assessment   Zadie Rhine, MD 11/06/23 [phone]

## 2023-11-06 NOTE — ED Notes (Signed)
Called patient name 3x no answer
